# Patient Record
Sex: Female | Born: 1985 | Race: White | Hispanic: No | Marital: Single
Health system: Midwestern US, Community
[De-identification: ages and names within clinical notes are randomized; demographics above are authoritative.]

## PROBLEM LIST (undated history)

## (undated) DIAGNOSIS — F419 Anxiety disorder, unspecified: Secondary | ICD-10-CM

## (undated) DIAGNOSIS — G2581 Restless legs syndrome: Secondary | ICD-10-CM

## (undated) DIAGNOSIS — F32A Depression, unspecified: Secondary | ICD-10-CM

## (undated) DIAGNOSIS — J45909 Unspecified asthma, uncomplicated: Secondary | ICD-10-CM

## (undated) DIAGNOSIS — F329 Major depressive disorder, single episode, unspecified: Secondary | ICD-10-CM

## (undated) HISTORY — PX: DILATION AND CURETTAGE OF UTERUS: SHX78

## (undated) HISTORY — PX: ADENOIDECTOMY: SUR15

## (undated) HISTORY — PX: TONSILLECTOMY: SUR1361

---
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
## 2016-07-02 ENCOUNTER — Encounter (HOSPITAL_COMMUNITY): Payer: Self-pay | Admitting: Emergency Medicine

## 2016-07-02 ENCOUNTER — Emergency Department (HOSPITAL_COMMUNITY): Payer: Medicaid - Out of State

## 2016-07-02 ENCOUNTER — Emergency Department (HOSPITAL_COMMUNITY)
Admission: EM | Admit: 2016-07-02 | Discharge: 2016-07-02 | Disposition: A | Discharge status: Home / Self Care | Payer: Medicaid - Out of State | Attending: Emergency Medicine | Admitting: Emergency Medicine

## 2016-07-02 DIAGNOSIS — J189 Pneumonia, unspecified organism: Secondary | ICD-10-CM | POA: Diagnosis not present

## 2016-07-02 DIAGNOSIS — F17210 Nicotine dependence, cigarettes, uncomplicated: Secondary | ICD-10-CM | POA: Diagnosis not present

## 2016-07-02 DIAGNOSIS — F1910 Other psychoactive substance abuse, uncomplicated: Secondary | ICD-10-CM

## 2016-07-02 DIAGNOSIS — Z79899 Other long term (current) drug therapy: Secondary | ICD-10-CM | POA: Insufficient documentation

## 2016-07-02 DIAGNOSIS — J45909 Unspecified asthma, uncomplicated: Secondary | ICD-10-CM | POA: Diagnosis not present

## 2016-07-02 DIAGNOSIS — T401X1A Poisoning by heroin, accidental (unintentional), initial encounter: Secondary | ICD-10-CM | POA: Diagnosis present

## 2016-07-02 HISTORY — DX: Restless legs syndrome: G25.81

## 2016-07-02 HISTORY — DX: Major depressive disorder, single episode, unspecified: F32.9

## 2016-07-02 HISTORY — DX: Depression, unspecified: F32.A

## 2016-07-02 HISTORY — DX: Anxiety disorder, unspecified: F41.9

## 2016-07-02 HISTORY — DX: Unspecified asthma, uncomplicated: J45.909

## 2016-07-02 LAB — COMPREHENSIVE METABOLIC PANEL
ALBUMIN: 3.9 g/dL (ref 3.5–5.0)
ALK PHOS: 64 U/L (ref 38–126)
ALT: 115 U/L — ABNORMAL HIGH (ref 14–54)
ANION GAP: 9 (ref 5–15)
AST: 81 U/L — ABNORMAL HIGH (ref 15–41)
BILIRUBIN TOTAL: 0.5 mg/dL (ref 0.3–1.2)
BUN: 15 mg/dL (ref 6–20)
CO2: 26 mmol/L (ref 22–32)
Calcium: 9.2 mg/dL (ref 8.9–10.3)
Chloride: 101 mmol/L (ref 101–111)
Creatinine, Ser: 0.81 mg/dL (ref 0.44–1.00)
GFR calc Af Amer: >60 mL/min (ref >60)
GLUCOSE: 102 mg/dL — AB (ref 65–99)
Potassium: 4 mmol/L (ref 3.5–5.1)
Sodium: 136 mmol/L (ref 135–145)
TOTAL PROTEIN: 8.1 g/dL (ref 6.5–8.1)

## 2016-07-02 LAB — URINALYSIS, ROUTINE W REFLEX MICROSCOPIC
BILIRUBIN URINE: NEGATIVE
Glucose, UA: NEGATIVE mg/dL
Hgb urine dipstick: NEGATIVE
Ketones, ur: NEGATIVE mg/dL
Leukocytes, UA: NEGATIVE
Nitrite: NEGATIVE
PH: 6 (ref 5.0–8.0)
Protein, ur: 30 mg/dL — AB
SPECIFIC GRAVITY, URINE: 1.012 (ref 1.005–1.030)

## 2016-07-02 LAB — CBC WITH DIFFERENTIAL/PLATELET
BASOS PCT: 0 %
Basophils Absolute: 0 10*3/uL (ref 0.0–0.1)
Eosinophils Absolute: 0 10*3/uL (ref 0.0–0.7)
Eosinophils Relative: 0 %
HEMATOCRIT: 36.7 % (ref 36.0–46.0)
Hemoglobin: 12.1 g/dL (ref 12.0–15.0)
LYMPHS ABS: 1.5 10*3/uL (ref 0.7–4.0)
LYMPHS PCT: 9 %
MCH: 27.6 pg (ref 26.0–34.0)
MCHC: 33 g/dL (ref 30.0–36.0)
MCV: 83.8 fL (ref 78.0–100.0)
MONO ABS: 1 10*3/uL (ref 0.1–1.0)
MONOS PCT: 6 %
NEUTROS ABS: 13.5 10*3/uL — AB (ref 1.7–7.7)
Neutrophils Relative %: 85 %
Platelets: 235 10*3/uL (ref 150–400)
RBC: 4.38 MIL/uL (ref 3.87–5.11)
RDW: 14.4 % (ref 11.5–15.5)
WBC: 16 10*3/uL — ABNORMAL HIGH (ref 4.0–10.5)

## 2016-07-02 LAB — RAPID URINE DRUG SCREEN, HOSP PERFORMED
AMPHETAMINES: NOT DETECTED
BENZODIAZEPINES: POSITIVE — AB
Barbiturates: NOT DETECTED
COCAINE: NOT DETECTED
OPIATES: POSITIVE — AB
Tetrahydrocannabinol: POSITIVE — AB

## 2016-07-02 LAB — ACETAMINOPHEN LEVEL: Acetaminophen (Tylenol), Serum: <10 ug/mL — ABNORMAL LOW (ref 10–30)

## 2016-07-02 LAB — PREGNANCY, URINE: PREG TEST UR: NEGATIVE

## 2016-07-02 LAB — SALICYLATE LEVEL: Salicylate Lvl: <7 mg/dL (ref 2.8–30.0)

## 2016-07-02 LAB — ETHANOL: Alcohol, Ethyl (B): <5 mg/dL (ref <5)

## 2016-07-02 MED ORDER — DOXYCYCLINE HYCLATE 100 MG PO TABS: 100.0000 mg | ORAL_TABLET | Freq: Two times a day (BID) | ORAL | 0 refills | Status: AC

## 2016-07-02 MED ORDER — ALBUTEROL SULFATE HFA 108 (90 BASE) MCG/ACT IN AERS: 2.0000 | INHALATION_SPRAY | RESPIRATORY_TRACT | 0 refills | Status: AC | PRN

## 2016-07-02 NOTE — Discharge Instructions (Signed)
Take the prescriptions as directed. Apply moist heat or ice to the area(s) of discomfort, for 15 minutes at a time, several times per day for the next few days.  Do not fall asleep on a heating or ice pack. Your liver function tests were mildly elevated today; you will need these rechecked by your regular medical doctor within the next week. Do not take tylenol, tylenol containing products, or drink alcohol until you are seen in follow up.  Call your regular medical doctor tomorrow to schedule a follow up appointment this week.  Return to the Emergency Department immediately sooner if worsening.

## 2016-07-02 NOTE — ED Notes (Signed)
Patient had EKG done upon arrival to room. Patient on 12 lead at this time. Dr Clarene Duke has seen EKG.
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
## 2016-07-02 NOTE — ED Notes (Signed)
Ambulated patient to restroom to obtain urine specimen. Patient very wobbly and groggy. Patient has unsteady gait. Patient assisted to and from restroom with hands on assistance. Patient has yellow socks and fall risk bracelet on. Moderate fall risk sign on door.

## 2016-07-02 NOTE — ED Notes (Signed)
Ambulated patient around nursing station, 02sat 94 percent on room air, heart rate 110-113. Patient states that she is having pain in her chest and upper back area. Reminded patient that CPR was performed on her.

## 2016-07-02 NOTE — ED Notes (Signed)
Pt provided coke.  No nausea or vomiting noted

## 2017-09-22 IMAGING — CT CT HEAD W/O CM
3 series · 16 of 47 positions shown, 19 images · non-contrast
Comparison: None.

CLINICAL DATA: Unresponsive episode.

EXAM:
CT HEAD WITHOUT CONTRAST
TECHNIQUE: Contiguous axial images were obtained from the base of the skull
through the vertex without intravenous contrast.

[Series 2: head wo · axial · 0.44mm/px · z∈[+1801,+1941]mm · 10 of 34 slices shown, 13 images]
[im 3/34  brain]
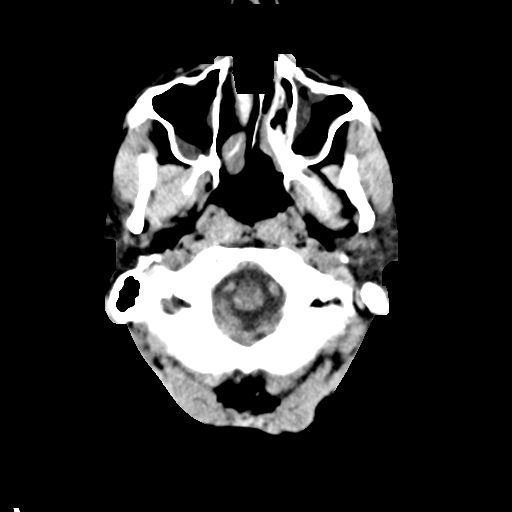
[im 3/34  bone]
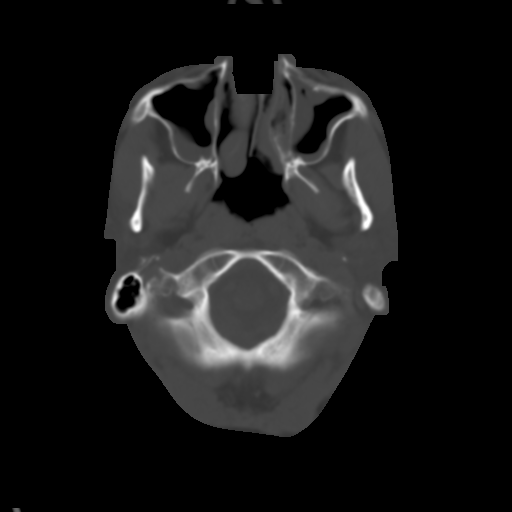
[im 6/34  brain]
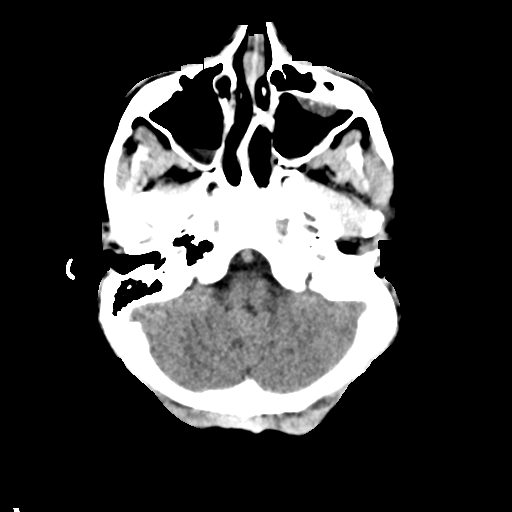
[im 10/34  brain]
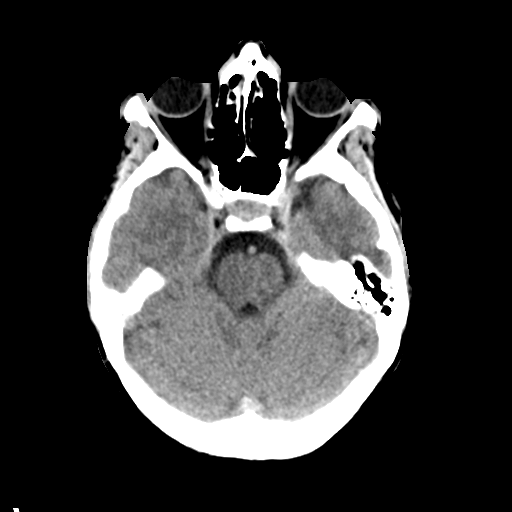
[im 12/34  brain]
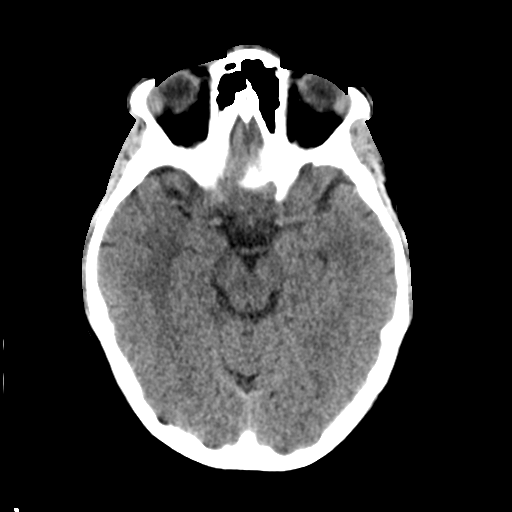
[im 15/34  brain]
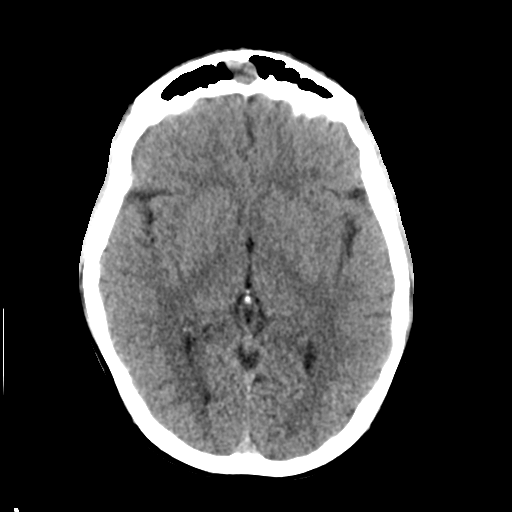
[im 15/34  bone]
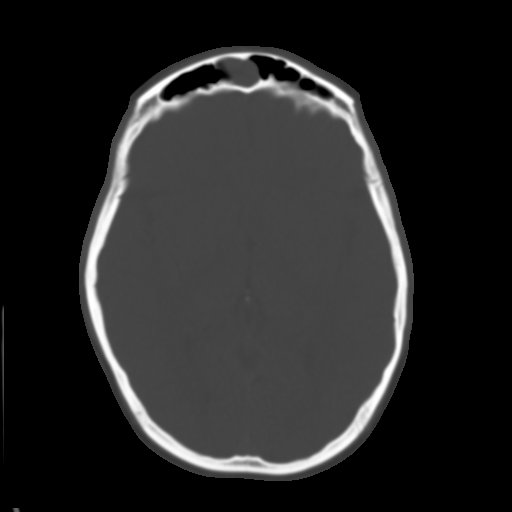
[im 19/34  brain]
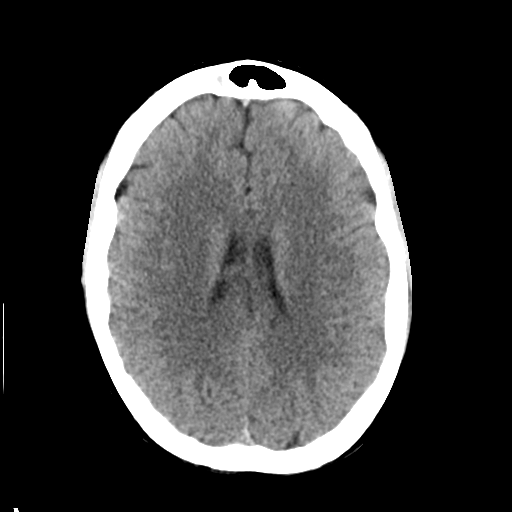
[im 22/34  brain]
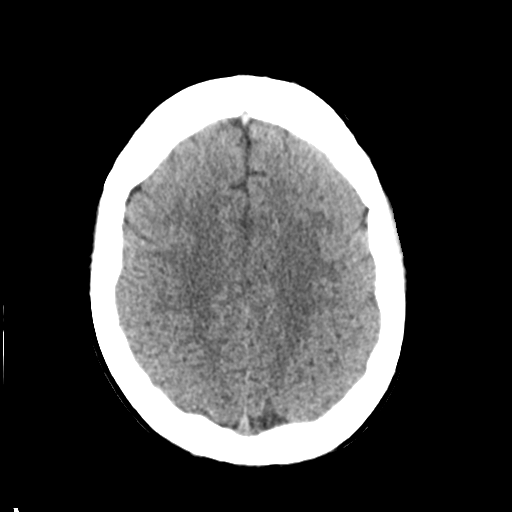
[im 26/34  brain]
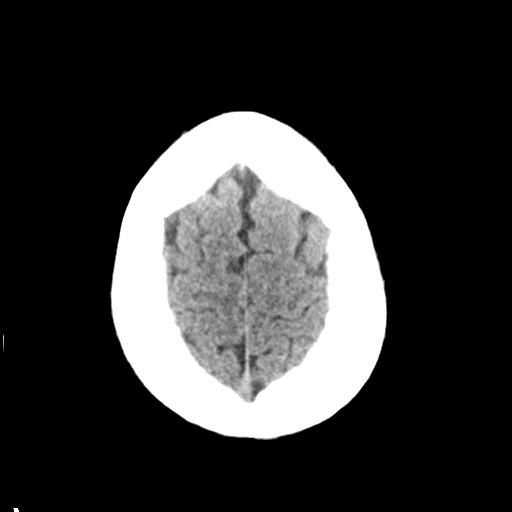
[im 28/34  brain]
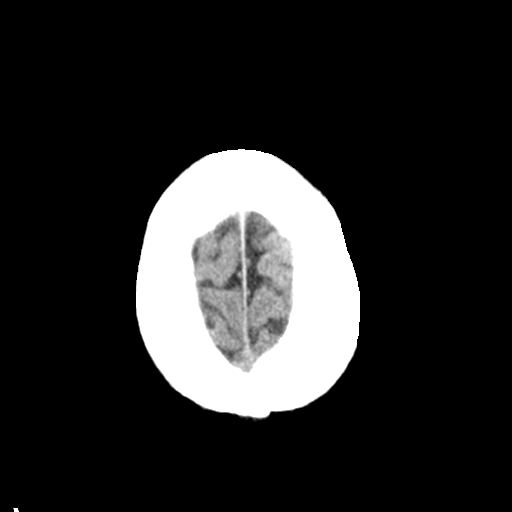
[im 28/34  bone]
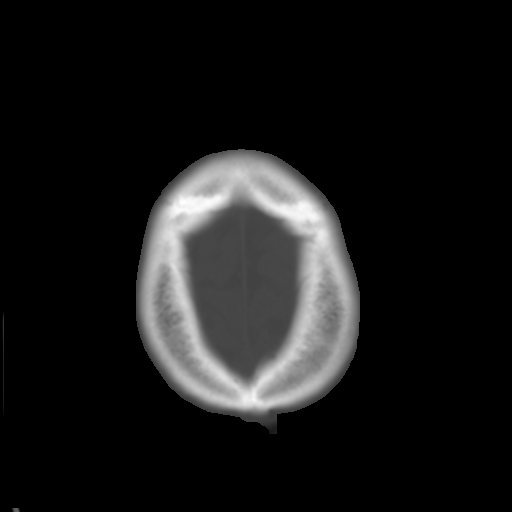
[im 31/34  brain]
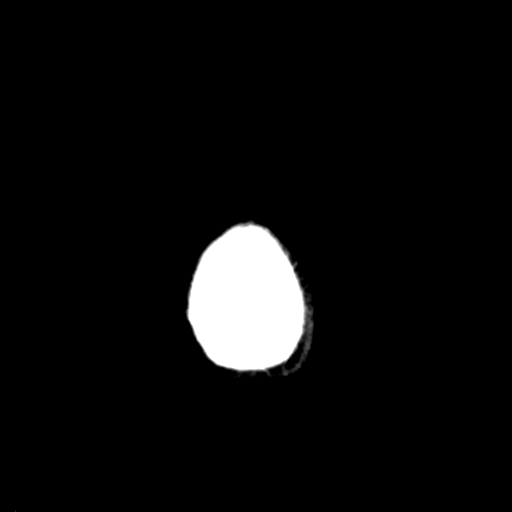

[Series 4: coronal soft tissue · coronal · 0.36mm/px · 3 of 72 slices shown]
[im 24/72  brain]
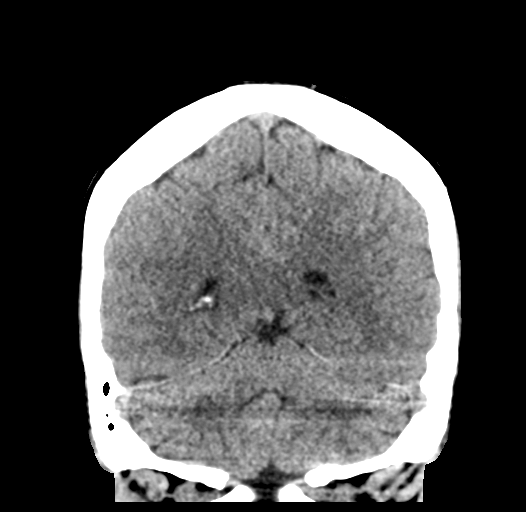
[im 32/72  brain]
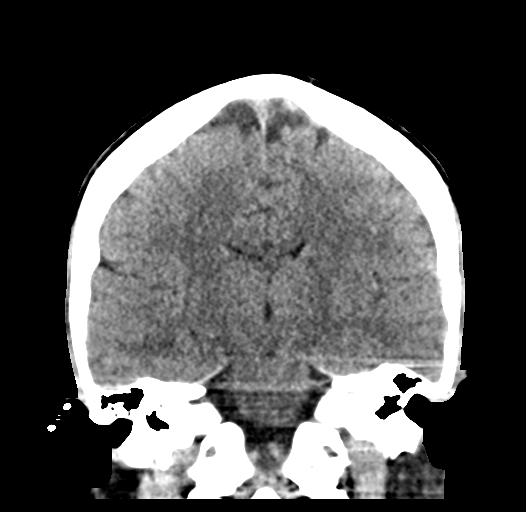
[im 40/72  brain]
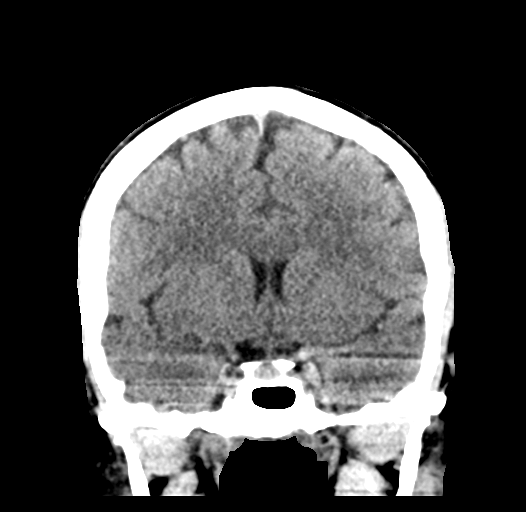

[Series 5: sagittal soft tissue · sagittal · 0.38mm/px · 3 of 66 slices shown]
[im 22/66  brain]
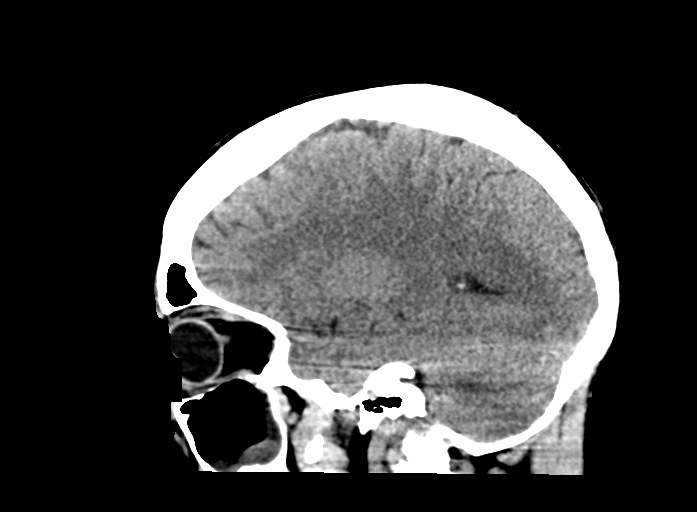
[im 33/66  brain]
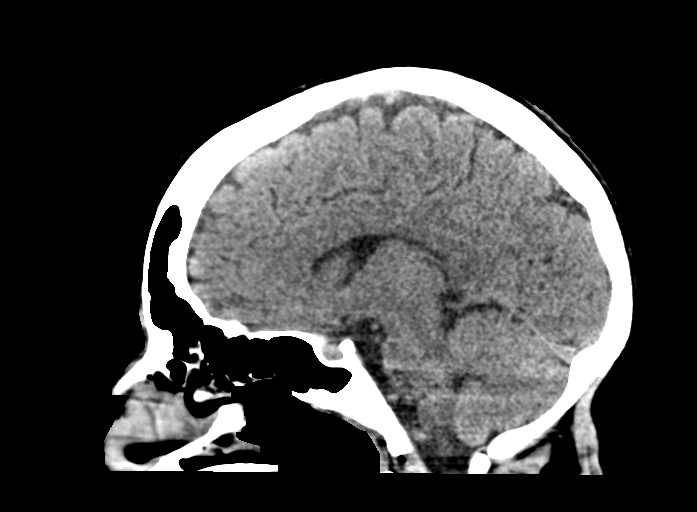
[im 44/66  brain]
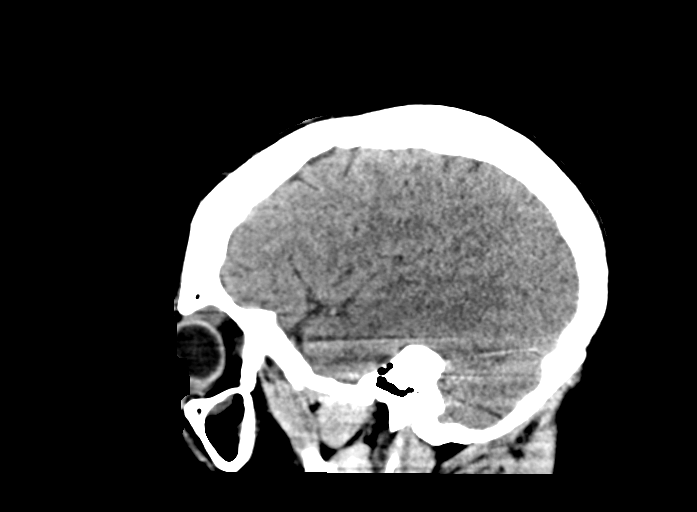

[16 of 47 positions shown; findings below may reference images not displayed]

FINDINGS: Brain: No mass lesion, intraparenchymal hemorrhage or extra-axial
collection. No evidence of acute cortical infarct. Brain parenchyma
and CSF-containing spaces are normal for age.

Vascular: No hyperdense vessel or unexpected calcification.

Skull: Normal visualized skull base, calvarium and extracranial soft
tissues.

Sinuses/Orbits: Polypoid mucosal thickening of the right frontal
sinus and the maxillary sinuses. Normal orbits.
IMPRESSION: 1. No acute intracranial abnormality.  Normal brain for age.
2. Polypoid mucosal thickening of the right frontal and bilateral
maxillary sinuses.

## 2017-09-22 IMAGING — DX DG CHEST 2V
3 series · 4 of 4 positions shown · non-contrast
Comparison: No priors.

CLINICAL DATA: 30-year-old female with history of narcotic
overdose.

EXAM:
CHEST  2 VIEW

[chest pa (1 of 2)]
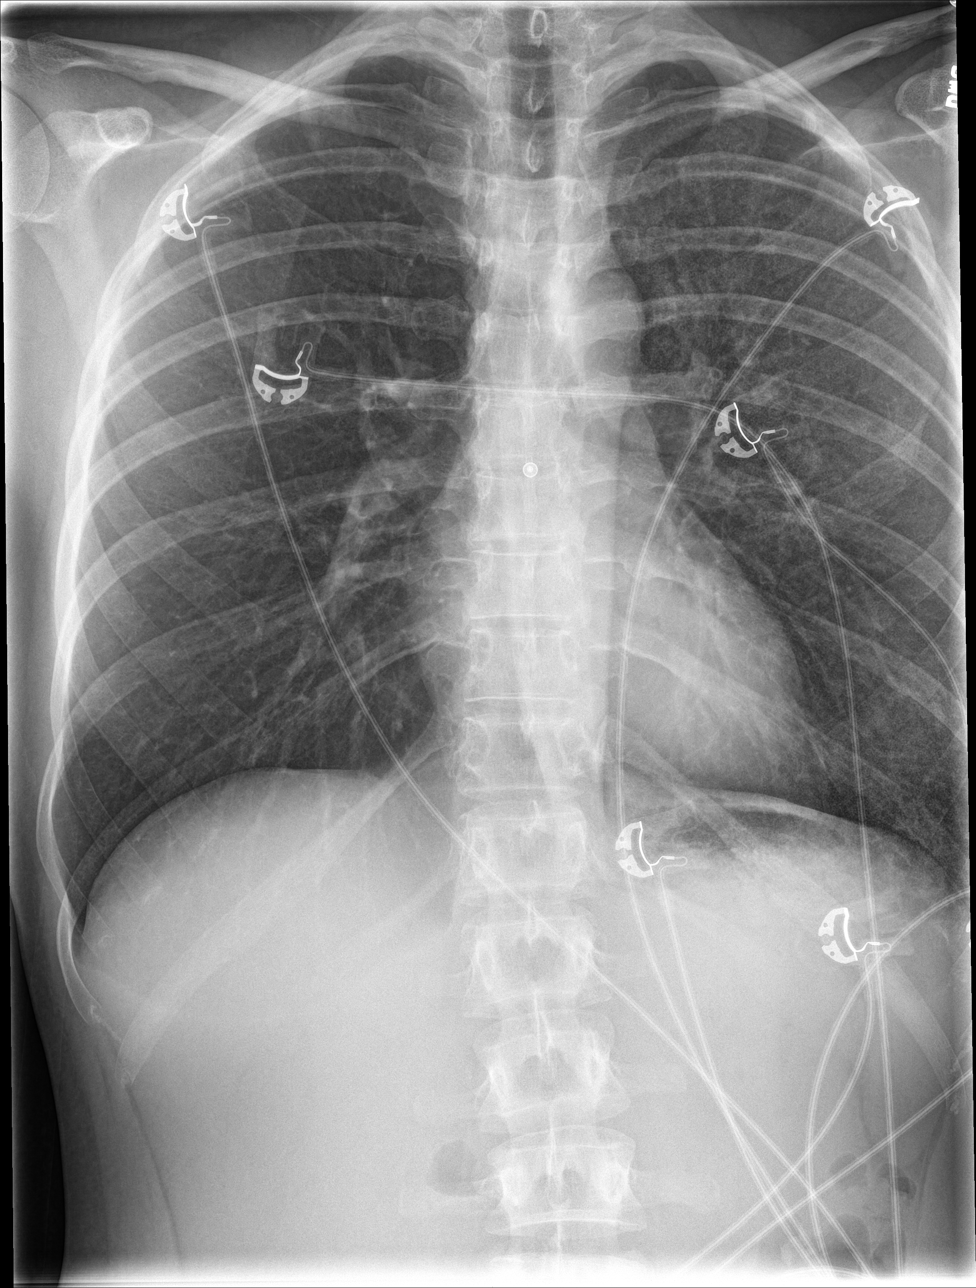

[Series 1: chest pa · 0.14mm/px · 2 of 2 slices shown (2 of 2)]
[im 1/2]
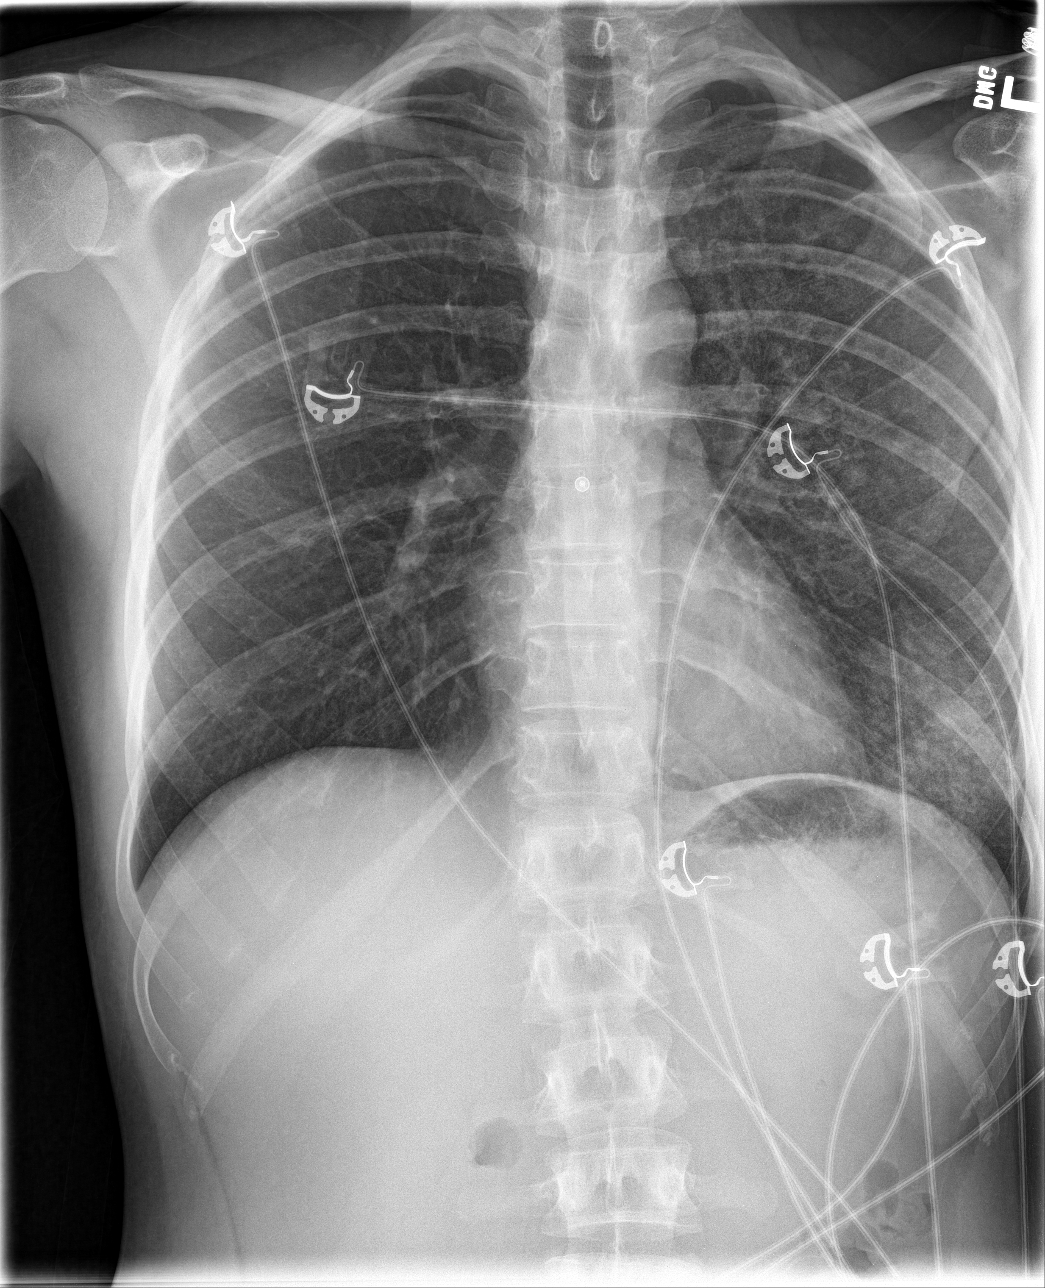
[im 2/2]
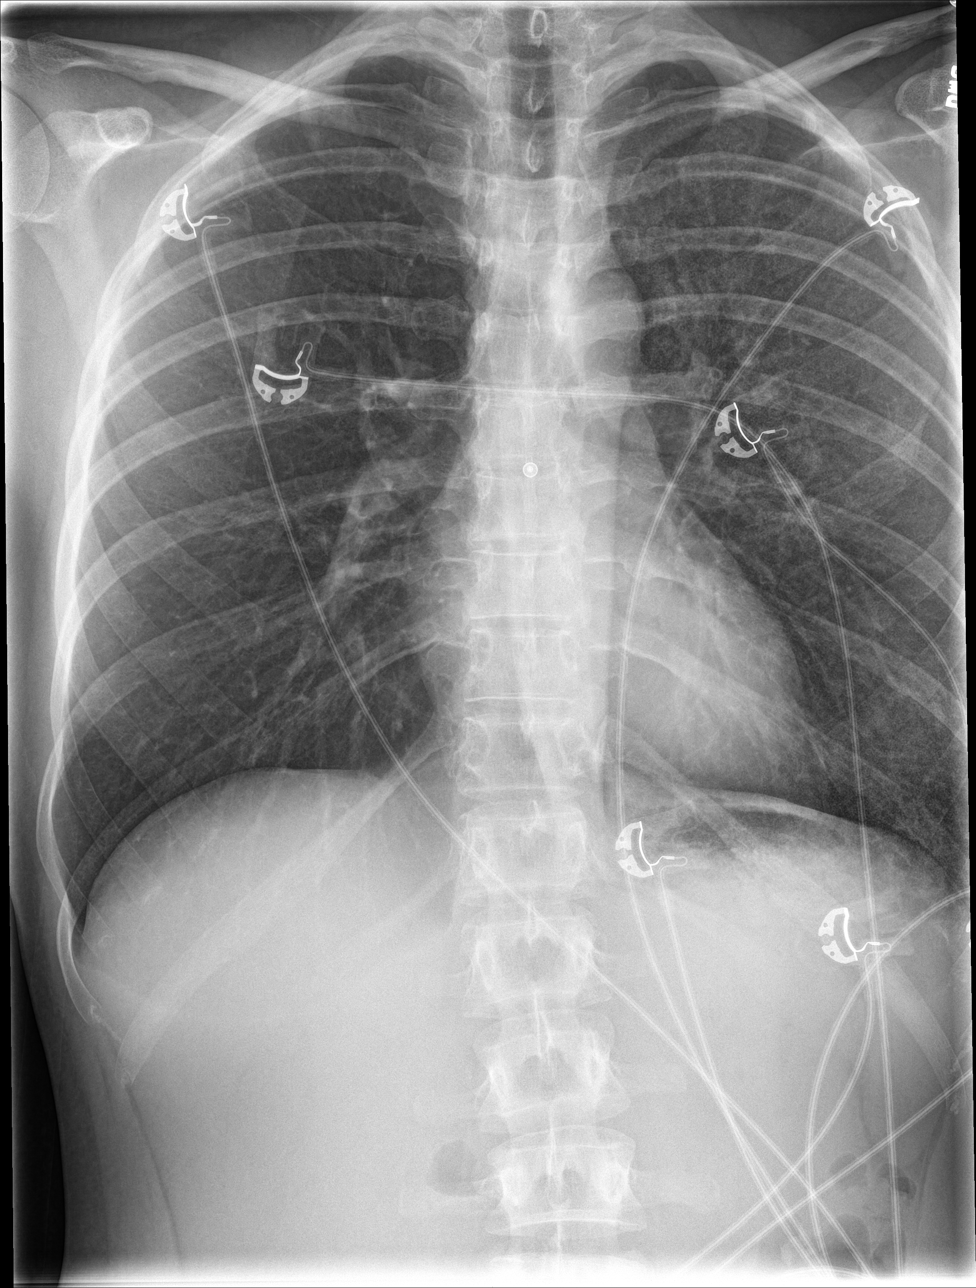

[chest lat]
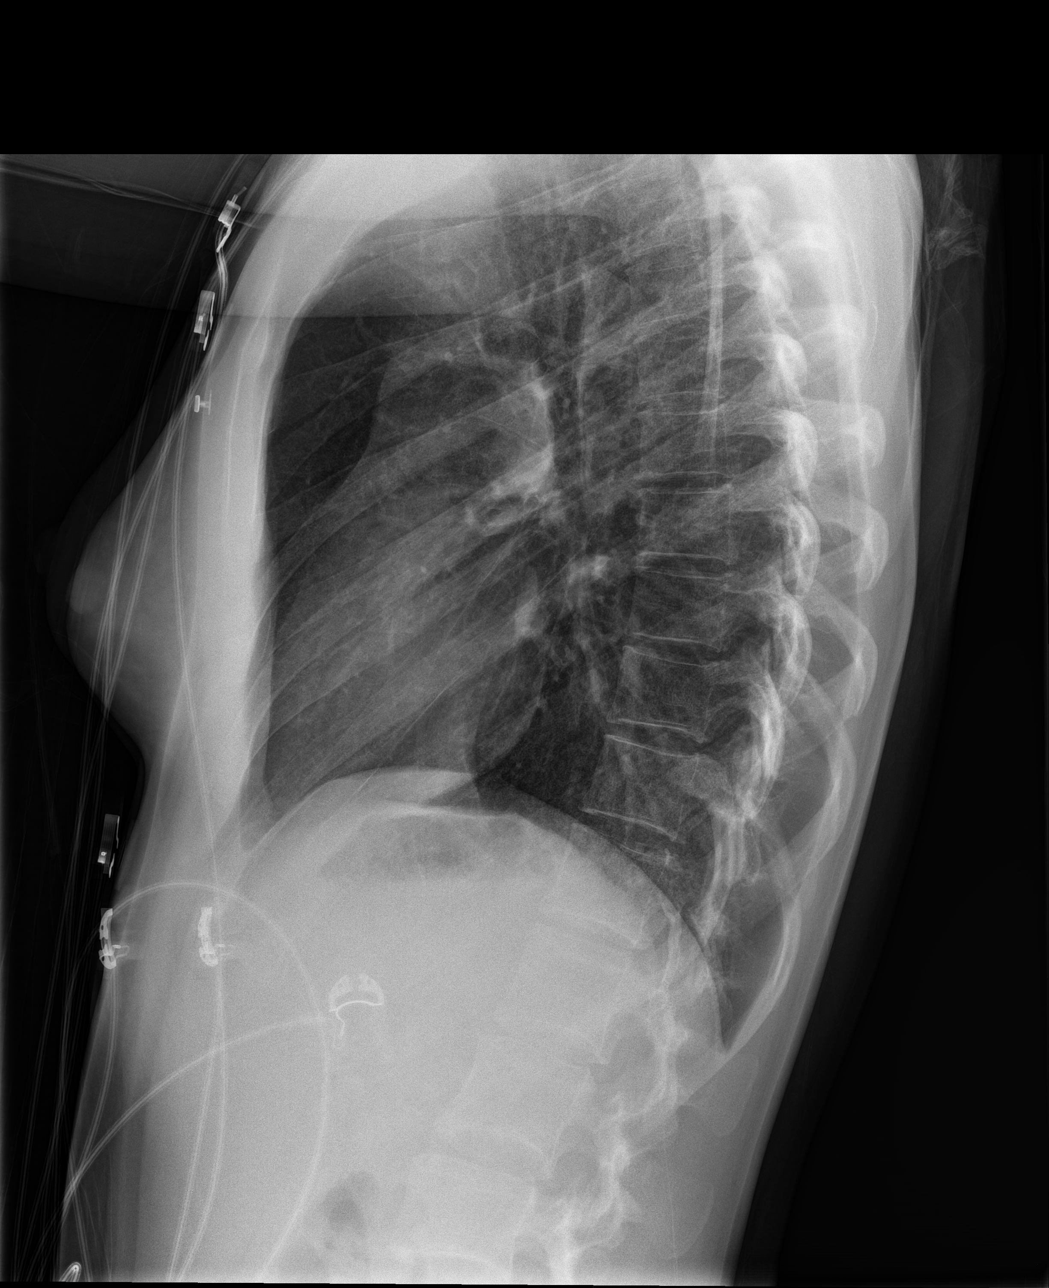

[4 of 4 positions shown; findings below may reference images not displayed]

FINDINGS: Ill-defined airspace consolidation in the left lower lobe,
concerning for developing bronchopneumonia or sequela of aspiration.
Right lung is clear. No pleural effusions. No evidence of pulmonary
edema. No pneumothorax. Heart size is normal. Upper mediastinal
contours are within normal limits.
IMPRESSION: 1. Probable aspiration pneumonitis or pneumonia in the left lower
lobe.

## 2020-04-07 ENCOUNTER — Inpatient Hospital Stay
Admit: 2020-04-07 | Discharge: 2020-04-07 | Disposition: A | Discharge status: Home / Self Care | Payer: MEDICAID | Attending: Emergency Medicine

## 2020-04-07 ENCOUNTER — Emergency Department: Admit: 2020-04-07 | Payer: MEDICAID

## 2020-04-07 DIAGNOSIS — S01511A Laceration without foreign body of lip, initial encounter: Secondary | ICD-10-CM

## 2020-04-07 MED ORDER — CYCLOBENZAPRINE 10 MG TAB
10 mg | ORAL_TABLET | Freq: Three times a day (TID) | ORAL | 0 refills | Status: AC | PRN
Start: 2020-04-07 — End: ?

## 2020-04-07 MED ORDER — NAPROXEN 500 MG TAB
500 mg | ORAL_TABLET | Freq: Two times a day (BID) | ORAL | 0 refills | Status: AC | PRN
Start: 2020-04-07 — End: ?
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
## 2020-04-07 NOTE — ED Notes (Signed)
I have reviewed discharge instructions with the patient.  The patient verbalized understanding. Patient made aware of prescriptions to be picked up at pharmacy. Ambulatory out of ED at this time.
